# Patient Record
Sex: Female | Born: 1980 | Race: White | Hispanic: No | Marital: Married | Smoking: Never smoker
Health system: Southern US, Community
[De-identification: ages and names within clinical notes are randomized; demographics above are authoritative.]

## PROBLEM LIST (undated history)

## (undated) DIAGNOSIS — C801 Malignant (primary) neoplasm, unspecified: Secondary | ICD-10-CM

## (undated) DIAGNOSIS — N189 Chronic kidney disease, unspecified: Secondary | ICD-10-CM

## (undated) HISTORY — PX: DIAGNOSTIC LAPAROSCOPY: SUR761

## (undated) HISTORY — PX: ABDOMINAL HYSTERECTOMY: SHX81

## (undated) HISTORY — PX: APPENDECTOMY: SHX54

## (undated) HISTORY — PX: OTHER SURGICAL HISTORY: SHX169

## (undated) SURGERY — Surgical Case
Anesthesia: *Unknown

---

## 2004-09-18 ENCOUNTER — Observation Stay: Payer: Self-pay | Admitting: Certified Nurse Midwife

## 2004-10-10 ENCOUNTER — Observation Stay: Payer: Self-pay | Admitting: Obstetrics and Gynecology

## 2004-10-11 ENCOUNTER — Observation Stay: Payer: Self-pay

## 2004-11-04 ENCOUNTER — Observation Stay: Payer: Self-pay | Admitting: Obstetrics and Gynecology

## 2004-11-06 ENCOUNTER — Inpatient Hospital Stay: Payer: Self-pay

## 2007-04-08 ENCOUNTER — Ambulatory Visit: Payer: Self-pay | Admitting: Unknown Physician Specialty

## 2007-04-16 ENCOUNTER — Ambulatory Visit: Payer: Self-pay | Admitting: Unknown Physician Specialty

## 2007-08-21 ENCOUNTER — Ambulatory Visit: Payer: Self-pay | Admitting: Unknown Physician Specialty

## 2007-08-27 ENCOUNTER — Inpatient Hospital Stay: Payer: Self-pay | Admitting: Unknown Physician Specialty

## 2008-05-13 ENCOUNTER — Ambulatory Visit: Payer: Self-pay | Admitting: Unknown Physician Specialty

## 2008-08-27 ENCOUNTER — Ambulatory Visit: Payer: Self-pay | Admitting: Gynecologic Oncology

## 2008-09-27 ENCOUNTER — Ambulatory Visit: Payer: Self-pay | Admitting: Gynecologic Oncology

## 2008-10-17 ENCOUNTER — Ambulatory Visit: Payer: Self-pay | Admitting: Unknown Physician Specialty

## 2008-10-25 ENCOUNTER — Ambulatory Visit: Payer: Self-pay | Admitting: Unknown Physician Specialty

## 2008-11-27 ENCOUNTER — Ambulatory Visit: Payer: Self-pay | Admitting: Gynecologic Oncology

## 2009-07-13 ENCOUNTER — Ambulatory Visit: Payer: Self-pay | Admitting: Unknown Physician Specialty

## 2009-08-01 ENCOUNTER — Ambulatory Visit: Payer: Self-pay | Admitting: Internal Medicine

## 2010-06-11 ENCOUNTER — Ambulatory Visit: Payer: Self-pay | Admitting: Internal Medicine

## 2011-01-04 ENCOUNTER — Emergency Department: Payer: Self-pay | Admitting: Emergency Medicine

## 2011-01-10 ENCOUNTER — Ambulatory Visit: Payer: Self-pay | Admitting: Internal Medicine

## 2011-07-11 ENCOUNTER — Ambulatory Visit: Payer: Self-pay | Admitting: Internal Medicine

## 2011-12-05 ENCOUNTER — Ambulatory Visit: Payer: Self-pay | Admitting: Family Medicine

## 2011-12-11 ENCOUNTER — Other Ambulatory Visit: Payer: Self-pay | Admitting: Urology

## 2011-12-12 ENCOUNTER — Encounter (HOSPITAL_COMMUNITY): Payer: Self-pay | Admitting: *Deleted

## 2011-12-12 NOTE — Progress Notes (Signed)
1526 Asked to bring her blue folder for the procedure,insurance card,ID,have a driver for the day.  Wear comfortable clothing Asked not to take Aspirin,Advil or Toradol 72 hours prior to her procedure. Instructed to eat a light dinner and take a laxative between 5 pm and 6 pm 12-18-11. To arrive for her procedure at 1300.

## 2011-12-18 ENCOUNTER — Encounter (HOSPITAL_COMMUNITY): Payer: Self-pay | Admitting: Pharmacy Technician

## 2011-12-18 ENCOUNTER — Encounter (HOSPITAL_COMMUNITY): Payer: Self-pay | Admitting: *Deleted

## 2011-12-18 NOTE — H&P (Signed)
History of Present Illness   Nephrolithiasis:   A renal ultrasound done on 12/05/11 revealed bilateral nephrolithiasis but no evidence of hydronephrosis or obstruction. She has a history of having had stones in the past. She has been seen in [place] but has not undergone any form of surgical intervention nor has she undergone a previous evaluation for the cause of her stones. 2 weeks ago she began to have pain in her right flank region radiating in her right abdomen. The pain was intermittent and moderately severe. It then resolved and then recurred again and was constant. It was associated with several episodes of gross hematuria. She was seen at [facility] for KUB was performed and she was told they saw stones in her kidneys as well as in her bladder. She continues to have intermittent right flank discomfort but it is not severe at this time. It is not modified by positional change.   Past Medical History Problems  1. History of  Esophageal Reflux 530.81 2. History of  Nephrolithiasis V13.01  Surgical History Problems  1. History of  Appendectomy 2. History of  Cervical Surgery (Gyn) 3. History of  Hysterectomy V45.77 4. History of  Knee Surgery 5. History of  Laparoscopy (Diagnostic)  Current Meds 1. Dicyclomine HCl TABS; Therapy: (Recorded:13Aug2013) to 2. Est Estrogens-Methyltest TABS; Therapy: (Recorded:13Aug2013) to 3. Flomax 0.4 MG Oral Capsule; Therapy: (Recorded:13Aug2013) to 4. Omeprazole 20 MG Oral Tablet Delayed Release; Therapy: (Recorded:13Aug2013) to  Allergies Medication  1. No Known Drug Allergies  Family History Problems  1. Paternal history of  Nephrolithiasis 2. Paternal history of  Prostate Cancer V16.42  Social History Problems    Caffeine Use   Never A Smoker   Occupation: Denied    History of  Alcohol Use  Review of Systems Genitourinary, constitutional, skin, eye, otolaryngeal, hematologic/lymphatic, cardiovascular, pulmonary, endocrine,  musculoskeletal, gastrointestinal, neurological and psychiatric system(s) were reviewed and pertinent findings if present are noted.  Genitourinary: urinary frequency, urinary urgency, dysuria, nocturia, incontinence and hematuria.  Gastrointestinal: nausea and abdominal pain, but no vomiting.  Constitutional: feeling tired (fatigue).  Musculoskeletal: back pain.    Vitals Vital Signs [Data Includes: Last 1 Day]  13Aug2013 02:06PM  BMI Calculated: 22.58 BSA Calculated: 1.44 Height: 4 ft 11 in Weight: 112 lb  Blood Pressure: 115 / 71 Temperature: 84 F Heart Rate: 84  Physical Exam Constitutional: Well nourished and well developed . No acute distress.  ENT:. The ears and nose are normal in appearance.  Neck: The appearance of the neck is normal and no neck mass is present.  Pulmonary: No respiratory distress and normal respiratory rhythm and effort.  Cardiovascular: Heart rate and rhythm are normal . No peripheral edema.  Abdomen: The abdomen is soft and nontender. No masses are palpated. No CVA tenderness. No hernias are palpable. No hepatosplenomegaly noted.  Lymphatics: The femoral and inguinal nodes are not enlarged or tender.  Skin: Normal skin turgor, no visible rash and no visible skin lesions.  Neuro/Psych:. Mood and affect are appropriate.    Results/Data Urine    COLOR YELLOW   APPEARANCE CLOUDY   SPECIFIC GRAVITY 1.010   pH 7.5   GLUCOSE NEG mg/dL  BILIRUBIN NEG   KETONE NEG mg/dL  BLOOD LARGE   PROTEIN NEG mg/dL  UROBILINOGEN 0.2 mg/dL  NITRITE NEG   LEUKOCYTE ESTERASE NEG   SQUAMOUS EPITHELIAL/HPF RARE   WBC 0-2 WBC/hpf  RBC 21-50 RBC/hpf  BACTERIA NONE SEEN   CRYSTALS NONE SEEN   CASTS NONE  SEEN    Old records or history reviewed: notes from Dr. Philis Pique office as above.  The following images/tracing/specimen were independently visualized:  KUB: She appears to have a calcification that is above the outline of the kidney on the right-hand side but there  also appears to be 2 calcifications overlying the upper pole of the left kidney. There also appears to be a possible stone near the UPJ region on the right-hand side.  The following clinical lab reports were reviewed:  She had microscopic hematuria today.  The following radiology reports were reviewed: Renal ultrasound as above.  Will request old records/history: I will obtain her previous notes from [facility].    Assessment Assessed  1. Proximal Ureteral Stone On The Right 592.1 2. Nephrolithiasis Of Both Kidneys 592.0   I reviewed her KUB images with her today. It appears she has bilateral nephrolithiasis as well as a stone located at the UPJ region on the right-hand side. She has several phleboliths in the pelvis but no bladder calculi. I then discussed the treatment options for her stone because she would like to have the stone treated. We discussed ureteroscopy and lithotripsy and I discussed each of these procedures with her in detail including the potential risks and complications and probability of success. She would like to proceed with lithotripsy. In addition I'm going to initiate her metabolic evaluation with a hypercalciuria profile today and then complete her workup once her stone has been treated with a 24-hour urine.   Plan   1. Hypercalciuria profile today. 2. She'll be scheduled for lithotripsy of a right UPJ stone. 3. She'll then undergo 24-hour urine studies once her stones passed

## 2011-12-19 ENCOUNTER — Encounter (HOSPITAL_COMMUNITY): Payer: Self-pay | Admitting: *Deleted

## 2011-12-19 ENCOUNTER — Encounter (HOSPITAL_COMMUNITY)
Admission: RE | Disposition: A | Discharge status: Home / Self Care | Payer: Self-pay | Source: Ambulatory Visit | Attending: Urology

## 2011-12-19 ENCOUNTER — Ambulatory Visit (HOSPITAL_COMMUNITY)
Admission: RE | Admit: 2011-12-19 | Discharge: 2011-12-19 | Disposition: A | Discharge status: Home / Self Care | Payer: BC Managed Care – PPO | Source: Ambulatory Visit | Attending: Urology | Admitting: Urology

## 2011-12-19 DIAGNOSIS — Z79899 Other long term (current) drug therapy: Secondary | ICD-10-CM | POA: Insufficient documentation

## 2011-12-19 DIAGNOSIS — N200 Calculus of kidney: Secondary | ICD-10-CM | POA: Insufficient documentation

## 2011-12-19 DIAGNOSIS — N201 Calculus of ureter: Secondary | ICD-10-CM

## 2011-12-19 DIAGNOSIS — K219 Gastro-esophageal reflux disease without esophagitis: Secondary | ICD-10-CM | POA: Insufficient documentation

## 2011-12-19 HISTORY — DX: Chronic kidney disease, unspecified: N18.9

## 2011-12-19 SURGERY — LITHOTRIPSY, ESWL
Anesthesia: LOCAL | Laterality: Right

## 2011-12-19 MED ORDER — HYDROMORPHONE HCL 2 MG PO TABS
4.0000 mg | ORAL_TABLET | ORAL | Status: AC | PRN
  Administered 2011-12-19: 4 mg via ORAL
  Filled 2011-12-19: qty 2

## 2011-12-19 MED ORDER — DIAZEPAM 5 MG PO TABS
10.0000 mg | ORAL_TABLET | ORAL | Status: AC
  Administered 2011-12-19: 10 mg via ORAL
  Filled 2011-12-19: qty 2

## 2011-12-19 MED ORDER — SODIUM CHLORIDE 0.9 % IV SOLN
INTRAVENOUS | Status: DC
  Administered 2011-12-19: 1000 mL via INTRAVENOUS

## 2011-12-19 MED ORDER — HYDROMORPHONE HCL 4 MG PO TABS: 4.0000 mg | ORAL_TABLET | ORAL | Status: AC | PRN

## 2011-12-19 MED ORDER — TAMSULOSIN HCL 0.4 MG PO CAPS: 0.4000 mg | ORAL_CAPSULE | ORAL | Status: DC

## 2011-12-19 MED ORDER — DIPHENHYDRAMINE HCL 25 MG PO CAPS
25.0000 mg | ORAL_CAPSULE | ORAL | Status: AC
  Administered 2011-12-19: 25 mg via ORAL
  Filled 2011-12-19: qty 1

## 2011-12-19 MED ORDER — CIPROFLOXACIN HCL 500 MG PO TABS
500.0000 mg | ORAL_TABLET | ORAL | Status: AC
  Administered 2011-12-19: 500 mg via ORAL
  Filled 2011-12-19: qty 1

## 2011-12-19 NOTE — Op Note (Signed)
See [facility] OP note scanned into chart. 

## 2013-05-12 ENCOUNTER — Ambulatory Visit: Payer: Self-pay | Admitting: Internal Medicine

## 2013-05-18 ENCOUNTER — Emergency Department: Payer: Self-pay | Admitting: Emergency Medicine

## 2013-05-18 LAB — CBC
HCT: 38.6 % (ref 35.0–47.0)
HGB: 13.2 g/dL (ref 12.0–16.0)
MCH: 30.5 pg (ref 26.0–34.0)
MCHC: 34.1 g/dL (ref 32.0–36.0)
MCV: 89 fL (ref 80–100)
Platelet: 259 10*3/uL (ref 150–440)
RBC: 4.32 10*6/uL (ref 3.80–5.20)
RDW: 13.9 % (ref 11.5–14.5)
WBC: 7.6 10*3/uL (ref 3.6–11.0)

## 2013-05-18 LAB — BASIC METABOLIC PANEL
Anion Gap: 8 (ref 7–16)
BUN: 11 mg/dL (ref 7–18)
CALCIUM: 9.5 mg/dL (ref 8.5–10.1)
CHLORIDE: 103 mmol/L (ref 98–107)
CREATININE: 0.75 mg/dL (ref 0.60–1.30)
Co2: 29 mmol/L (ref 21–32)
GLUCOSE: 96 mg/dL (ref 65–99)
OSMOLALITY: 279 (ref 275–301)
Potassium: 3.4 mmol/L — ABNORMAL LOW (ref 3.5–5.1)
Sodium: 140 mmol/L (ref 136–145)

## 2013-05-18 LAB — URINALYSIS, COMPLETE
BACTERIA: NONE SEEN
Bilirubin,UR: NEGATIVE
Blood: NEGATIVE
Glucose,UR: NEGATIVE mg/dL (ref 0–75)
KETONE: NEGATIVE
Leukocyte Esterase: NEGATIVE
NITRITE: NEGATIVE
PH: 8 (ref 4.5–8.0)
PROTEIN: NEGATIVE
RBC, UR: NONE SEEN /HPF (ref 0–5)
SPECIFIC GRAVITY: 1.004 (ref 1.003–1.030)
Squamous Epithelial: NONE SEEN
WBC UR: <1 /HPF (ref 0–5)

## 2013-05-18 LAB — TROPONIN I: Troponin-I: <0.02 ng/mL

## 2013-05-18 LAB — CK TOTAL AND CKMB (NOT AT ARMC)
CK, Total: 42 U/L (ref 21–215)
CK-MB: <0.5 ng/mL — ABNORMAL LOW (ref 0.5–3.6)

## 2015-10-09 ENCOUNTER — Other Ambulatory Visit: Payer: Self-pay | Admitting: Obstetrics and Gynecology

## 2015-10-09 DIAGNOSIS — Z1231 Encounter for screening mammogram for malignant neoplasm of breast: Secondary | ICD-10-CM

## 2015-10-26 ENCOUNTER — Other Ambulatory Visit: Payer: Self-pay | Admitting: Obstetrics and Gynecology

## 2015-10-26 ENCOUNTER — Ambulatory Visit
Admission: RE | Admit: 2015-10-26 | Discharge: 2015-10-26 | Disposition: A | Discharge status: Home / Self Care | Payer: BC Managed Care – PPO | Source: Ambulatory Visit | Attending: Obstetrics and Gynecology | Admitting: Obstetrics and Gynecology

## 2015-10-26 DIAGNOSIS — Z1231 Encounter for screening mammogram for malignant neoplasm of breast: Secondary | ICD-10-CM

## 2015-10-26 HISTORY — DX: Malignant (primary) neoplasm, unspecified: C80.1

## 2015-11-21 ENCOUNTER — Other Ambulatory Visit: Payer: Self-pay | Admitting: Internal Medicine

## 2015-11-21 ENCOUNTER — Ambulatory Visit
Admission: RE | Admit: 2015-11-21 | Discharge: 2015-11-21 | Disposition: A | Discharge status: Home / Self Care | Payer: BC Managed Care – PPO | Source: Ambulatory Visit | Attending: Internal Medicine | Admitting: Internal Medicine

## 2015-11-21 DIAGNOSIS — R0781 Pleurodynia: Secondary | ICD-10-CM

## 2015-11-21 DIAGNOSIS — N200 Calculus of kidney: Secondary | ICD-10-CM | POA: Insufficient documentation

## 2015-11-21 MED ORDER — IOPAMIDOL (ISOVUE-370) INJECTION 76%
75.0000 mL | Freq: Once | INTRAVENOUS | Status: AC | PRN
  Administered 2015-11-21: 75 mL via INTRAVENOUS

## 2019-03-05 ENCOUNTER — Other Ambulatory Visit: Payer: Self-pay | Admitting: Obstetrics and Gynecology

## 2019-03-08 ENCOUNTER — Other Ambulatory Visit: Payer: Self-pay | Admitting: Obstetrics and Gynecology

## 2019-03-08 DIAGNOSIS — Z1231 Encounter for screening mammogram for malignant neoplasm of breast: Secondary | ICD-10-CM

## 2019-08-13 ENCOUNTER — Other Ambulatory Visit: Payer: Self-pay | Admitting: Internal Medicine

## 2019-08-13 DIAGNOSIS — M25462 Effusion, left knee: Secondary | ICD-10-CM

## 2019-08-18 ENCOUNTER — Ambulatory Visit
Admission: RE | Admit: 2019-08-18 | Discharge: 2019-08-18 | Disposition: A | Discharge status: Home / Self Care | Payer: BC Managed Care – PPO | Source: Ambulatory Visit | Attending: Internal Medicine | Admitting: Internal Medicine

## 2019-08-18 ENCOUNTER — Other Ambulatory Visit: Payer: Self-pay

## 2019-08-18 DIAGNOSIS — M25462 Effusion, left knee: Secondary | ICD-10-CM | POA: Diagnosis not present

## 2019-08-19 ENCOUNTER — Other Ambulatory Visit: Payer: Self-pay | Admitting: Internal Medicine

## 2019-08-19 DIAGNOSIS — R221 Localized swelling, mass and lump, neck: Secondary | ICD-10-CM

## 2020-07-12 IMAGING — US US EXTREM LOW*L* LIMITED
1 series · 14 of 15 positions shown · non-contrast
Comparison: None.

CLINICAL DATA: Left knee swelling.

EXAM:
ULTRASOUND left LOWER EXTREMITY LIMITED
TECHNIQUE: Ultrasound examination of the lower extremity soft tissues was
performed in the area of clinical concern.

[Series 1: us left lower extrem ltd soft tissue non vascular · 14 of 15 slices shown]
[im 1/15]
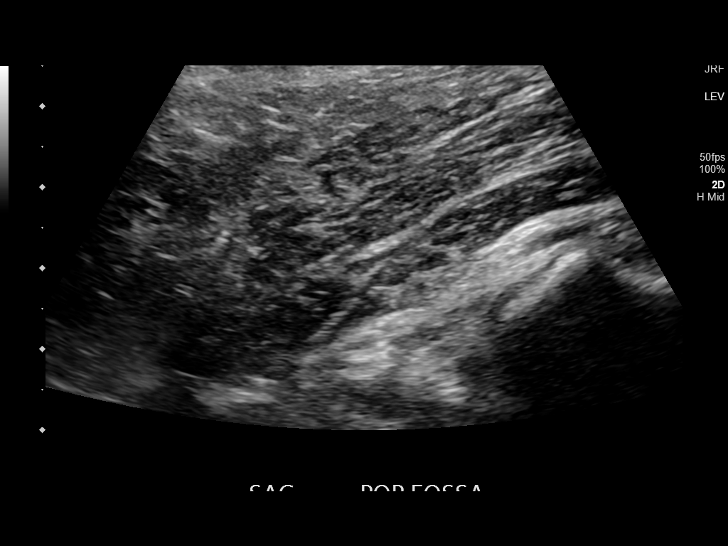
[im 2/15]
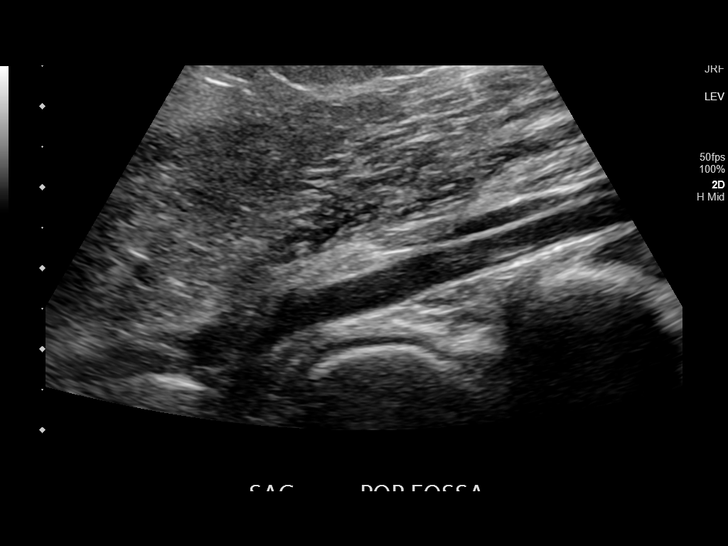
[im 3/15]
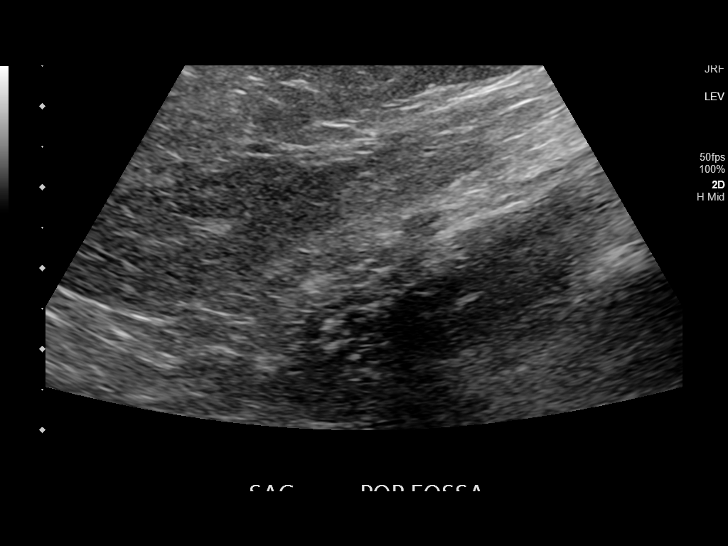
[im 4/15]
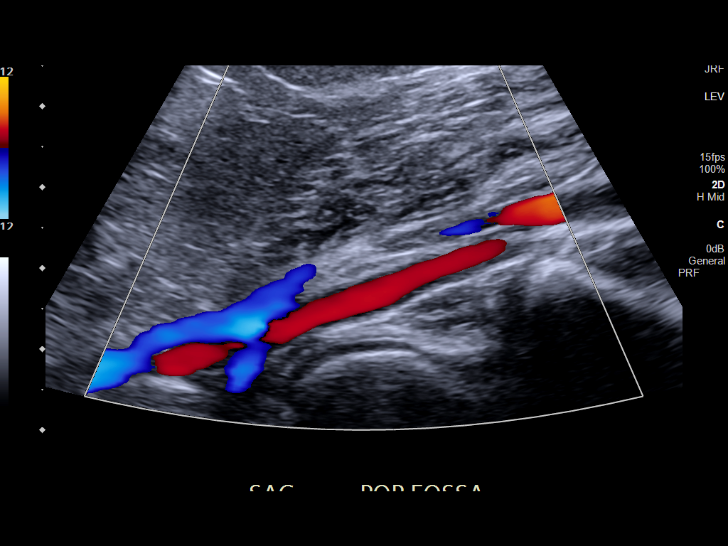
[im 5/15]
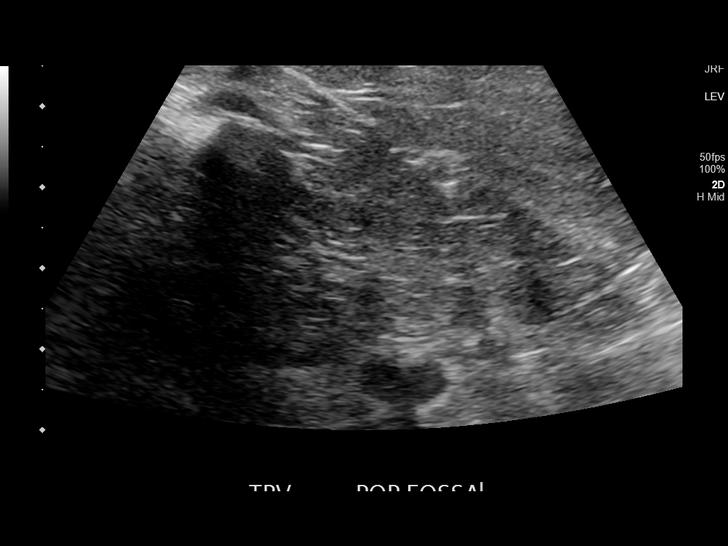
[im 6/15]
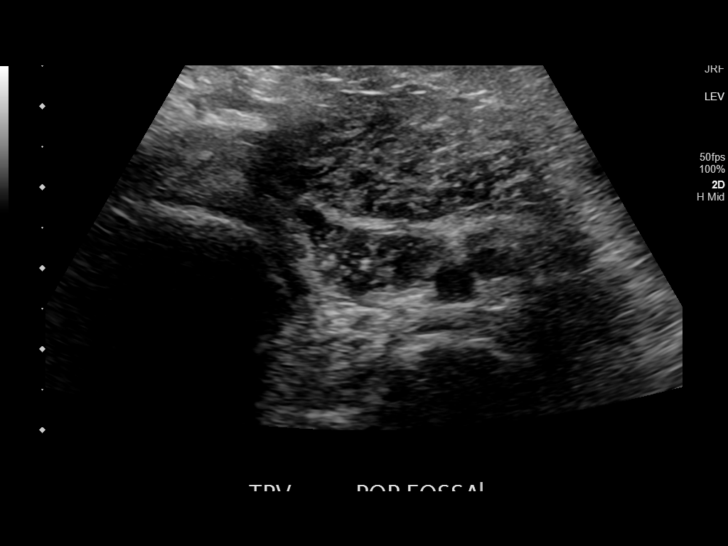
[im 7/15]
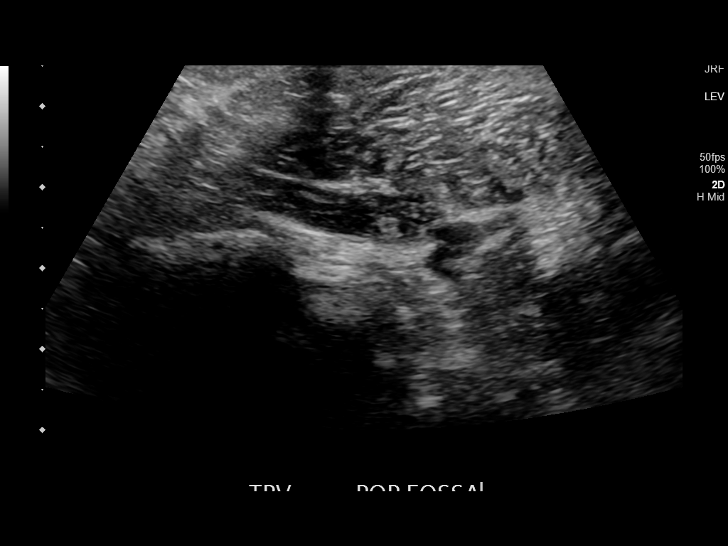
[im 9/15]
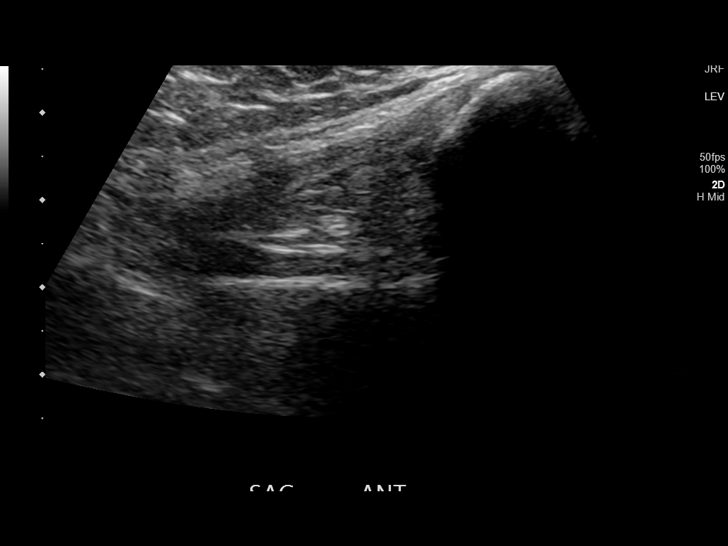
[im 10/15]
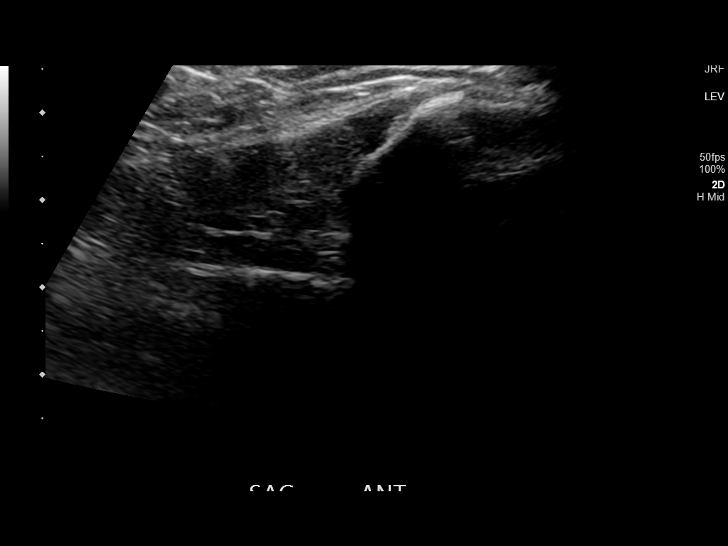
[im 11/15]
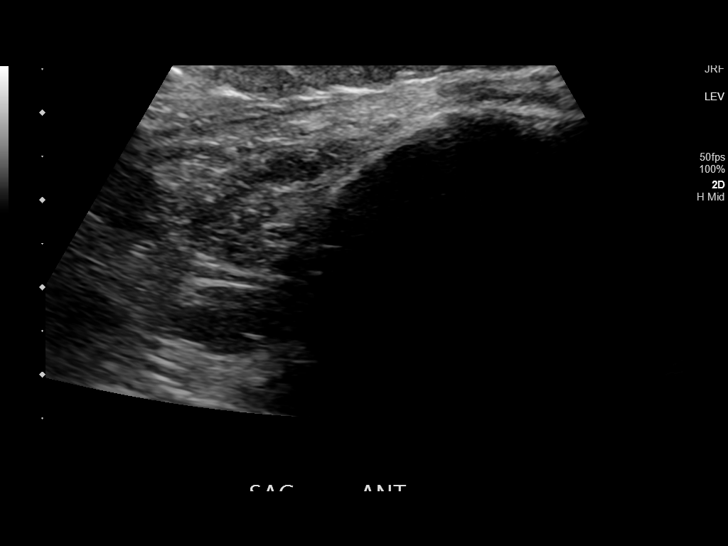
[im 12/15]
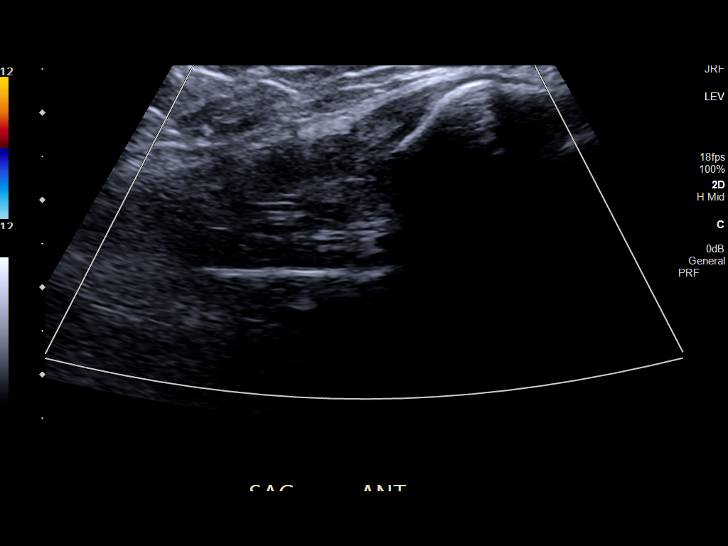
[im 13/15]
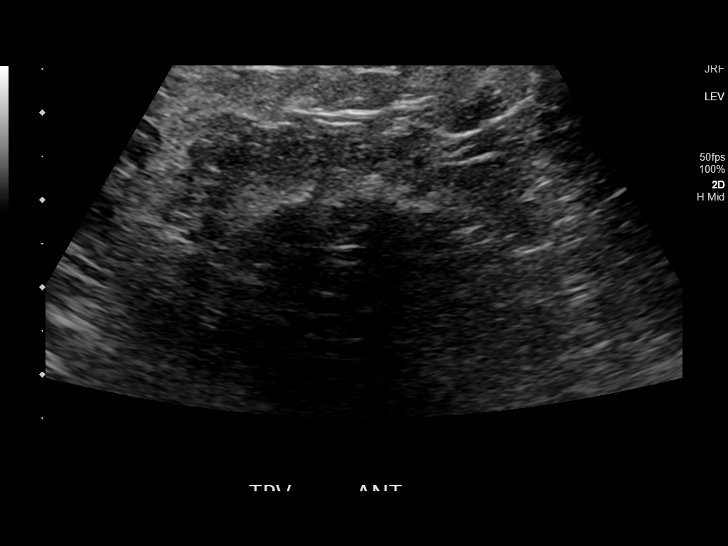
[im 14/15]
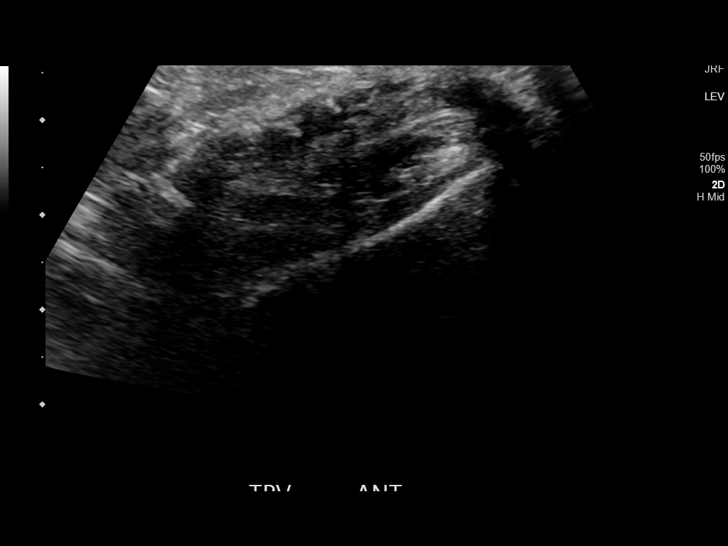
[im 15/15]
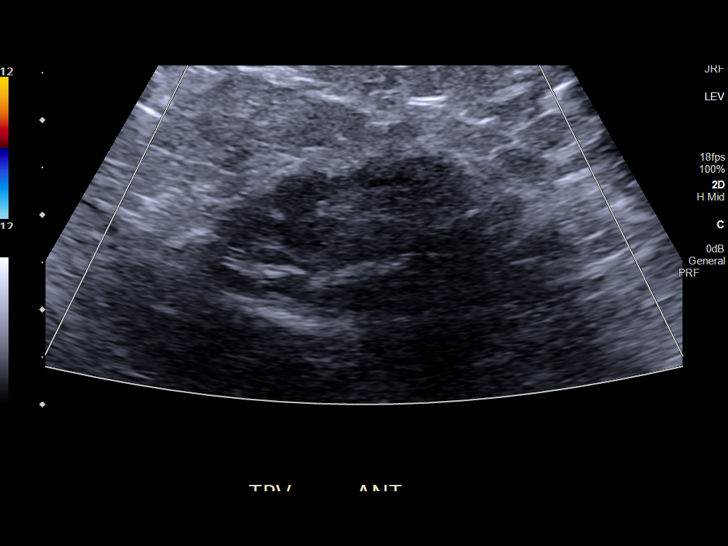

[14 of 15 positions shown; findings below may reference images not displayed]

FINDINGS: Limited sonographic evaluation of the left knee region demonstrates
no evidence of mass, cyst or fluid collection.
IMPRESSION: No definite sonographic abnormality seen in the area of concern in
the left knee.

## 2022-03-12 ENCOUNTER — Other Ambulatory Visit: Payer: Self-pay | Admitting: Neurology

## 2022-03-12 DIAGNOSIS — R42 Dizziness and giddiness: Secondary | ICD-10-CM

## 2022-03-17 ENCOUNTER — Ambulatory Visit
Admission: RE | Admit: 2022-03-17 | Discharge: 2022-03-17 | Disposition: A | Discharge status: Home / Self Care | Payer: BC Managed Care – PPO | Source: Ambulatory Visit | Attending: Neurology | Admitting: Neurology

## 2022-03-17 DIAGNOSIS — R42 Dizziness and giddiness: Secondary | ICD-10-CM | POA: Insufficient documentation

## 2022-03-17 MED ORDER — GADOBUTROL 1 MMOL/ML IV SOLN
5.0000 mL | Freq: Once | INTRAVENOUS | Status: AC | PRN
  Administered 2022-03-17: 7.5 mL via INTRAVENOUS

## 2022-11-06 ENCOUNTER — Inpatient Hospital Stay: Payer: BC Managed Care – PPO | Attending: Oncology | Admitting: Oncology

## 2022-11-06 ENCOUNTER — Encounter: Payer: Self-pay | Admitting: Oncology

## 2022-11-06 ENCOUNTER — Inpatient Hospital Stay: Payer: BC Managed Care – PPO

## 2022-11-06 VITALS — BP 128/79 | HR 68 | Temp 96.5°F | Ht 59.0 in

## 2022-11-06 DIAGNOSIS — D509 Iron deficiency anemia, unspecified: Secondary | ICD-10-CM | POA: Diagnosis present

## 2022-11-06 DIAGNOSIS — Z8542 Personal history of malignant neoplasm of other parts of uterus: Secondary | ICD-10-CM | POA: Diagnosis not present

## 2022-11-06 LAB — CBC WITH DIFFERENTIAL (CANCER CENTER ONLY)
Abs Immature Granulocytes: 0.02 10*3/uL (ref 0.00–0.07)
Basophils Absolute: 0 10*3/uL (ref 0.0–0.1)
Basophils Relative: 0 %
Eosinophils Absolute: 0.1 10*3/uL (ref 0.0–0.5)
Eosinophils Relative: 1 %
HCT: 36.1 % (ref 36.0–46.0)
Hemoglobin: 12 g/dL (ref 12.0–15.0)
Immature Granulocytes: 0 %
Lymphocytes Relative: 28 %
Lymphs Abs: 2.6 10*3/uL (ref 0.7–4.0)
MCH: 28.2 pg (ref 26.0–34.0)
MCHC: 33.2 g/dL (ref 30.0–36.0)
MCV: 84.9 fL (ref 80.0–100.0)
Monocytes Absolute: 0.7 10*3/uL (ref 0.1–1.0)
Monocytes Relative: 8 %
Neutro Abs: 5.7 10*3/uL (ref 1.7–7.7)
Neutrophils Relative %: 63 %
Platelet Count: 315 10*3/uL (ref 150–400)
RBC: 4.25 MIL/uL (ref 3.87–5.11)
RDW: 13.7 % (ref 11.5–15.5)
WBC Count: 9.2 10*3/uL (ref 4.0–10.5)
nRBC: 0 % (ref 0.0–0.2)

## 2022-11-06 LAB — IRON AND TIBC
Iron: 51 ug/dL (ref 28–170)
Saturation Ratios: 13 % (ref 10.4–31.8)
TIBC: 407 ug/dL (ref 250–450)
UIBC: 356 ug/dL

## 2022-11-06 LAB — FERRITIN: Ferritin: 15 ng/mL (ref 11–307)

## 2022-11-06 NOTE — Progress Notes (Signed)
Hematology/Oncology Consult note [facility] [phone] [phone]  Patient Care Team: Barbette Reichmann, MD as PCP - General (Internal Medicine)   Name of the patient: April Huffman  [id number]  [birth year]    Reason for referral-iron deficiency anemia   Referring physician-Dr. Marcello Fennel  Date of visit: 11/06/22   History of presenting illness- Patient is a 42 year old female with a past medical history significant for migraine headaches, GERD who has been referred for iron deficiency anemia.  Most recent CBC from 09/19/2022 showed white count of 6.9, H&H of 12.1/37.5 and a platelet count of 321.  Iron studies from May 2024 showed a low ferritin of 7.  TSH was normal at 3.2.  Folate was normal at 7.5.    ECOG PS- 0  Pain scale-0   Review of systems- Review of Systems  Constitutional:  Positive for malaise/fatigue. Negative for chills, fever and weight loss.  HENT:  Negative for congestion, ear discharge and nosebleeds.   Eyes:  Negative for blurred vision.  Respiratory:  Negative for cough, hemoptysis, sputum production, shortness of breath and wheezing.   Cardiovascular:  Negative for chest pain, palpitations, orthopnea and claudication.  Gastrointestinal:  Negative for abdominal pain, blood in stool, constipation, diarrhea, heartburn, melena, nausea and vomiting.  Genitourinary:  Negative for dysuria, flank pain, frequency, hematuria and urgency.  Musculoskeletal:  Negative for back pain, joint pain and myalgias.  Skin:  Negative for rash.  Neurological:  Negative for dizziness, tingling, focal weakness, seizures, weakness and headaches.  Endo/Heme/Allergies:  Does not bruise/bleed easily.  Psychiatric/Behavioral:  Negative for depression and suicidal ideas. The patient does not have insomnia.     No Known Allergies  There are no problems to display for this patient.    Past Medical History:  Diagnosis Date   Cancer (HCC)     cervical   Chronic kidney disease    kidney stones     Past Surgical History:  Procedure Laterality Date   ABDOMINAL HYSTERECTOMY     APPENDECTOMY     cervix removed     DIAGNOSTIC LAPAROSCOPY     Right knee Arthroscopy      Social History   Socioeconomic History   Marital status: Married    Spouse name: Not on file   Number of children: Not on file   Years of education: Not on file   Highest education level: Not on file  Occupational History   Not on file  Tobacco Use   Smoking status: Not on file   Smokeless tobacco: Never  Substance and Sexual Activity   Alcohol use: No   Drug use: No   Sexual activity: Yes  Other Topics Concern   Not on file  Social History Narrative   Not on file   Social Determinants of Health   Financial Resource Strain: Not on file  Food Insecurity: Not on file  Transportation Needs: Not on file  Physical Activity: Not on file  Stress: Not on file  Social Connections: Not on file  Intimate Partner Violence: Not on file     Family History  Problem Relation Age of Onset   Breast cancer Neg Hx      Current Outpatient Medications:    dicyclomine (BENTYL) 10 MG capsule, Take 10 mg by mouth 2 (two) times daily., Disp: , Rfl:    estrogen-methylTESTOSTERone (COVARYX HS) 0.625-1.25 MG per tablet, Take 1 tablet by mouth daily., Disp: , Rfl:    omeprazole (PRILOSEC) 20 MG capsule, Take  20 mg by mouth daily., Disp: , Rfl:    Tamsulosin HCl (FLOMAX) 0.4 MG CAPS, Take 0.4 mg by mouth daily., Disp: , Rfl:    Tamsulosin HCl (FLOMAX) 0.4 MG CAPS, Take 1 capsule (0.4 mg total) by mouth daily after supper., Disp: 30 capsule, Rfl: 11   Physical exam:  Vitals:   11/06/22 1320  Height: 4\' 11"  (1.499 m)   Physical Exam Cardiovascular:     Rate and Rhythm: Normal rate and regular rhythm.     Heart sounds: Normal heart sounds.  Pulmonary:     Effort: Pulmonary effort is normal.     Breath sounds: Normal breath sounds.  Abdominal:     General:  Bowel sounds are normal.     Palpations: Abdomen is soft.  Skin:    General: Skin is warm and dry.  Neurological:     Mental Status: She is alert and oriented to person, place, and time.           Latest Ref Rng & Units 05/18/2013   10:42 AM  CMP  Glucose 65 - 99 mg/dL 96   BUN 7 - 18 mg/dL 11   Creatinine 8.11 - 1.30 mg/dL 9.14   Sodium 782 - 956 mmol/L 140   Potassium 3.5 - 5.1 mmol/L 3.4   Chloride 98 - 107 mmol/L 103   CO2 21 - 32 mmol/L 29   Calcium 8.5 - 10.1 mg/dL 9.5       Latest Ref Rng & Units 05/18/2013   10:42 AM  CBC  WBC 3.6 - 11.0 x10 3/mm 3 7.6   Hemoglobin 12.0 - 16.0 g/dL 21.3   Hematocrit 08.6 - 47.0 % 38.6   Platelets 150 - 440 x10 3/mm 3 259     No images are attached to the encounter.  No results found.  Assessment and plan- Patient is a 42 year old female referred for iron deficiency anemia  Although patient's hemoglobin is near normal at 12.1 labs are clearly indicated of iron deficiency.  She reports ongoing fatigue.  She is s/p hysterectomy and therefore menorrhagia is not contributing to her iron deficiency anemia.  I will refer her to GI for further workup of iron deficiency anemia.  Given that she is symptomatic we will proceed with IV iron at this time.  Based on her insurance we will plan to give her 2 doses of Feraheme 510 mg IV weekly.  Discussed risks and benefits of Feraheme including all but not limited to possible risk of infusion reaction.  Patient understands and agrees to proceed as planned.  Repeat CBC ferritin and iron studies in 6 weeks and I will see her thereafter   Thank you for this kind referral and the opportunity to participate in the care of this  Patient   Visit Diagnosis 1. Iron deficiency anemia, unspecified iron deficiency anemia type     Dr. Owens Shark, MD, MPH [facility] [phone] 11/06/2022

## 2022-11-06 NOTE — Progress Notes (Signed)
C/o dizziness since last fall.

## 2022-11-11 ENCOUNTER — Other Ambulatory Visit: Payer: Self-pay | Admitting: Oncology

## 2022-11-11 ENCOUNTER — Inpatient Hospital Stay: Payer: BC Managed Care – PPO

## 2022-11-11 VITALS — BP 112/76 | HR 60 | Temp 98.5°F | Resp 18

## 2022-11-11 DIAGNOSIS — D509 Iron deficiency anemia, unspecified: Secondary | ICD-10-CM | POA: Insufficient documentation

## 2022-11-11 DIAGNOSIS — D508 Other iron deficiency anemias: Secondary | ICD-10-CM

## 2022-11-11 MED ORDER — SODIUM CHLORIDE 0.9 % IV SOLN
Freq: Once | INTRAVENOUS | Status: AC
  Filled 2022-11-11: qty 250

## 2022-11-11 MED ORDER — SODIUM CHLORIDE 0.9 % IV SOLN
510.0000 mg | INTRAVENOUS | Status: DC
  Administered 2022-11-11: 510 mg via INTRAVENOUS
  Filled 2022-11-11: qty 510

## 2022-11-11 NOTE — Patient Instructions (Signed)
Ferumoxytol Injection What is this medication? FERUMOXYTOL (FER ue MOX i tol) treats low levels of iron in your body (iron deficiency anemia). Iron is a mineral that plays an important role in making red blood cells, which carry oxygen from your lungs to the rest of your body. This medicine may be used for other purposes; ask your health care provider or pharmacist if you have questions. COMMON BRAND NAME(S): Feraheme What should I tell my care team before I take this medication? They need to know if you have any of these conditions: Anemia not caused by low iron levels High levels of iron in the blood Magnetic resonance imaging (MRI) test scheduled An unusual or allergic reaction to iron, other medications, foods, dyes, or preservatives Pregnant or trying to get pregnant Breastfeeding How should I use this medication? This medication is injected into a vein. It is given by your care team in a hospital or clinic setting. Talk to your care team the use of this medication in children. Special care may be needed. Overdosage: If you think you have taken too much of this medicine contact a poison control center or emergency room at once. NOTE: This medicine is only for you. Do not share this medicine with others. What if I miss a dose? It is important not to miss your dose. Call your care team if you are unable to keep an appointment. What may interact with this medication? Other iron products This list may not describe all possible interactions. Give your health care provider a list of all the medicines, herbs, non-prescription drugs, or dietary supplements you use. Also tell them if you smoke, drink alcohol, or use illegal drugs. Some items may interact with your medicine. What should I watch for while using this medication? Visit your care team regularly. Tell your care team if your symptoms do not start to get better or if they get worse. You may need blood work done while you are taking this  medication. You may need to follow a special diet. Talk to your care team. Foods that contain iron include: whole grains/cereals, dried fruits, beans, or peas, leafy green vegetables, and organ meats (liver, kidney). What side effects may I notice from receiving this medication? Side effects that you should report to your care team as soon as possible: Allergic reactions--skin rash, itching, hives, swelling of the face, lips, tongue, or throat Low blood pressure--dizziness, feeling faint or lightheaded, blurry vision Shortness of breath Side effects that usually do not require medical attention (report to your care team if they continue or are bothersome): Flushing Headache Joint pain Muscle pain Nausea Pain, redness, or irritation at injection site This list may not describe all possible side effects. Call your doctor for medical advice about side effects. You may report side effects to FDA at 1-800-FDA-1088. Where should I keep my medication? This medication is given in a hospital or clinic and will not be stored at home. NOTE: This sheet is a summary. It may not cover all possible information. If you have questions about this medicine, talk to your doctor, pharmacist, or health care provider.  2024 Elsevier/Gold Standard (2021-10-22 00:00:00)  

## 2022-11-18 ENCOUNTER — Inpatient Hospital Stay: Payer: BC Managed Care – PPO

## 2022-11-18 ENCOUNTER — Encounter: Payer: Self-pay | Admitting: Oncology

## 2022-11-18 VITALS — BP 117/82 | HR 82 | Temp 97.2°F | Resp 18

## 2022-11-18 DIAGNOSIS — D508 Other iron deficiency anemias: Secondary | ICD-10-CM

## 2022-11-18 DIAGNOSIS — D509 Iron deficiency anemia, unspecified: Secondary | ICD-10-CM | POA: Diagnosis not present

## 2022-11-18 MED ORDER — SODIUM CHLORIDE 0.9 % IV SOLN
510.0000 mg | INTRAVENOUS | Status: DC
  Administered 2022-11-18: 510 mg via INTRAVENOUS
  Filled 2022-11-18: qty 510

## 2022-11-18 MED ORDER — SODIUM CHLORIDE 0.9 % IV SOLN
Freq: Once | INTRAVENOUS | Status: AC
  Filled 2022-11-18: qty 250

## 2022-11-18 NOTE — Patient Instructions (Addendum)
Ferumoxytol Injection What is this medication? FERUMOXYTOL (FER ue MOX i tol) treats low levels of iron in your body (iron deficiency anemia). Iron is a mineral that plays an important role in making red blood cells, which carry oxygen from your lungs to the rest of your body. This medicine may be used for other purposes; ask your health care provider or pharmacist if you have questions. COMMON BRAND NAME(S): Feraheme What should I tell my care team before I take this medication? They need to know if you have any of these conditions: Anemia not caused by low iron levels High levels of iron in the blood Magnetic resonance imaging (MRI) test scheduled An unusual or allergic reaction to iron, other medications, foods, dyes, or preservatives Pregnant or trying to get pregnant Breastfeeding How should I use this medication? This medication is injected into a vein. It is given by your care team in a hospital or clinic setting. Talk to your care team the use of this medication in children. Special care may be needed. Overdosage: If you think you have taken too much of this medicine contact a poison control center or emergency room at once. NOTE: This medicine is only for you. Do not share this medicine with others. What if I miss a dose? It is important not to miss your dose. Call your care team if you are unable to keep an appointment. What may interact with this medication? Other iron products This list may not describe all possible interactions. Give your health care provider a list of all the medicines, herbs, non-prescription drugs, or dietary supplements you use. Also tell them if you smoke, drink alcohol, or use illegal drugs. Some items may interact with your medicine. What should I watch for while using this medication? Visit your care team regularly. Tell your care team if your symptoms do not start to get better or if they get worse. You may need blood work done while you are taking this  medication. You may need to follow a special diet. Talk to your care team. Foods that contain iron include: whole grains/cereals, dried fruits, beans, or peas, leafy green vegetables, and organ meats (liver, kidney). What side effects may I notice from receiving this medication? Side effects that you should report to your care team as soon as possible: Allergic reactions--skin rash, itching, hives, swelling of the face, lips, tongue, or throat Low blood pressure--dizziness, feeling faint or lightheaded, blurry vision Shortness of breath Side effects that usually do not require medical attention (report to your care team if they continue or are bothersome): Flushing Headache Joint pain Muscle pain Nausea Pain, redness, or irritation at injection site This list may not describe all possible side effects. Call your doctor for medical advice about side effects. You may report side effects to FDA at 1-800-FDA-1088. Where should I keep my medication? This medication is given in a hospital or clinic. It will not be stored at home. NOTE: This sheet is a summary. It may not cover all possible information. If you have questions about this medicine, talk to your doctor, pharmacist, or health care provider.  2024 Elsevier/Gold Standard (2022-09-20 00:00:00)

## 2022-12-12 ENCOUNTER — Telehealth: Payer: Self-pay | Admitting: Oncology

## 2022-12-12 NOTE — Telephone Encounter (Signed)
Patient wants to know if she has to be seen to be told she needs iron. She wants to cancel MD appointment and keep labs and someone just call to tell her if she needs iron. Please advise.  Thank you

## 2022-12-12 NOTE — Telephone Encounter (Signed)
Keep labs in August as is. Cancel my appt. Cbc ferritin and iron studies again end of November and see at that time

## 2022-12-13 ENCOUNTER — Encounter: Payer: Self-pay | Admitting: *Deleted

## 2022-12-16 ENCOUNTER — Other Ambulatory Visit: Payer: Self-pay | Admitting: *Deleted

## 2022-12-16 DIAGNOSIS — D509 Iron deficiency anemia, unspecified: Secondary | ICD-10-CM

## 2022-12-18 ENCOUNTER — Other Ambulatory Visit: Payer: BC Managed Care – PPO

## 2022-12-23 ENCOUNTER — Ambulatory Visit: Payer: BC Managed Care – PPO | Admitting: Oncology

## 2022-12-23 ENCOUNTER — Inpatient Hospital Stay: Payer: BC Managed Care – PPO | Attending: Oncology

## 2022-12-23 DIAGNOSIS — D509 Iron deficiency anemia, unspecified: Secondary | ICD-10-CM | POA: Diagnosis present

## 2022-12-23 LAB — CBC WITH DIFFERENTIAL (CANCER CENTER ONLY)
Abs Immature Granulocytes: 0.07 10*3/uL (ref 0.00–0.07)
Basophils Absolute: 0 10*3/uL (ref 0.0–0.1)
Basophils Relative: 1 %
Eosinophils Absolute: 0.1 10*3/uL (ref 0.0–0.5)
Eosinophils Relative: 2 %
HCT: 39.3 % (ref 36.0–46.0)
Hemoglobin: 12.9 g/dL (ref 12.0–15.0)
Immature Granulocytes: 1 %
Lymphocytes Relative: 27 %
Lymphs Abs: 1.8 10*3/uL (ref 0.7–4.0)
MCH: 29.1 pg (ref 26.0–34.0)
MCHC: 32.8 g/dL (ref 30.0–36.0)
MCV: 88.5 fL (ref 80.0–100.0)
Monocytes Absolute: 0.6 10*3/uL (ref 0.1–1.0)
Monocytes Relative: 9 %
Neutro Abs: 4 10*3/uL (ref 1.7–7.7)
Neutrophils Relative %: 60 %
Platelet Count: 284 10*3/uL (ref 150–400)
RBC: 4.44 MIL/uL (ref 3.87–5.11)
RDW: 14.3 % (ref 11.5–15.5)
WBC Count: 6.6 10*3/uL (ref 4.0–10.5)
nRBC: 0 % (ref 0.0–0.2)

## 2022-12-23 LAB — FERRITIN: Ferritin: 303 ng/mL (ref 11–307)

## 2022-12-23 LAB — IRON AND TIBC
Iron: 127 ug/dL (ref 28–170)
Saturation Ratios: 43 % — ABNORMAL HIGH (ref 10.4–31.8)
TIBC: 298 ug/dL (ref 250–450)
UIBC: 171 ug/dL

## 2023-03-26 ENCOUNTER — Other Ambulatory Visit: Payer: BC Managed Care – PPO

## 2023-03-26 ENCOUNTER — Ambulatory Visit: Payer: BC Managed Care – PPO | Admitting: Oncology

## 2023-04-28 ENCOUNTER — Encounter: Payer: Self-pay | Admitting: Oncology
